# Patient Record
Sex: Female | Born: 1975 | Race: White | Hispanic: No | Marital: Married | Smoking: Never smoker
Health system: Southern US, Community
[De-identification: ages and names within clinical notes are randomized; demographics above are authoritative.]

## PROBLEM LIST (undated history)

## (undated) DIAGNOSIS — Q7961 Classical Ehlers-Danlos syndrome: Secondary | ICD-10-CM

## (undated) DIAGNOSIS — E039 Hypothyroidism, unspecified: Secondary | ICD-10-CM

## (undated) DIAGNOSIS — D649 Anemia, unspecified: Secondary | ICD-10-CM

## (undated) DIAGNOSIS — Z8489 Family history of other specified conditions: Secondary | ICD-10-CM

## (undated) DIAGNOSIS — T8859XA Other complications of anesthesia, initial encounter: Secondary | ICD-10-CM

## (undated) DIAGNOSIS — Z9889 Other specified postprocedural states: Secondary | ICD-10-CM

## (undated) DIAGNOSIS — D8940 Mast cell activation, unspecified: Secondary | ICD-10-CM

## (undated) DIAGNOSIS — N946 Dysmenorrhea, unspecified: Secondary | ICD-10-CM

## (undated) HISTORY — PX: BREAST BIOPSY: SHX20

## (undated) HISTORY — PX: KNEE ARTHROSCOPY: SHX127

---
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
## 2007-01-29 HISTORY — PX: TOTAL THYROIDECTOMY: SHX2547

## 2007-01-29 HISTORY — PX: PARATHYROIDECTOMY: SHX19

## 2007-04-29 DIAGNOSIS — C73 Malignant neoplasm of thyroid gland: Secondary | ICD-10-CM

## 2007-04-29 HISTORY — DX: Malignant neoplasm of thyroid gland: C73

## 2008-01-29 DIAGNOSIS — O009 Unspecified ectopic pregnancy without intrauterine pregnancy: Secondary | ICD-10-CM

## 2008-01-29 HISTORY — DX: Unspecified ectopic pregnancy without intrauterine pregnancy: O00.90

## 2008-01-29 HISTORY — PX: ECTOPIC PREGNANCY SURGERY: SHX613

## 2013-04-19 ENCOUNTER — Emergency Department: Payer: Self-pay | Admitting: Emergency Medicine

## 2013-04-20 LAB — CBC
HCT: 35.3 % (ref 35.0–47.0)
HGB: 11.1 g/dL — AB (ref 12.0–16.0)
MCH: 22.4 pg — ABNORMAL LOW (ref 26.0–34.0)
MCHC: 31.5 g/dL — ABNORMAL LOW (ref 32.0–36.0)
MCV: 71 fL — ABNORMAL LOW (ref 80–100)
Platelet: 321 10*3/uL (ref 150–440)
RBC: 4.95 10*6/uL (ref 3.80–5.20)
RDW: 16.8 % — ABNORMAL HIGH (ref 11.5–14.5)
WBC: 13.5 10*3/uL — ABNORMAL HIGH (ref 3.6–11.0)

## 2013-04-20 LAB — BASIC METABOLIC PANEL
ANION GAP: 5 — AB (ref 7–16)
BUN: 22 mg/dL — AB (ref 7–18)
Calcium, Total: 8.7 mg/dL (ref 8.5–10.1)
Chloride: 106 mmol/L (ref 98–107)
Co2: 27 mmol/L (ref 21–32)
Creatinine: 0.89 mg/dL (ref 0.60–1.30)
EGFR (African American): >60
Glucose: 145 mg/dL — ABNORMAL HIGH (ref 65–99)
Osmolality: 282 (ref 275–301)
POTASSIUM: 4.5 mmol/L (ref 3.5–5.1)
SODIUM: 138 mmol/L (ref 136–145)

## 2013-04-20 LAB — HEPATIC FUNCTION PANEL A (ARMC)
ALK PHOS: 169 U/L — AB
ALT: 36 U/L (ref 12–78)
Albumin: 3.7 g/dL (ref 3.4–5.0)
BILIRUBIN TOTAL: 0.2 mg/dL (ref 0.2–1.0)
SGOT(AST): 21 U/L (ref 15–37)
Total Protein: 7.6 g/dL (ref 6.4–8.2)

## 2013-04-20 LAB — TROPONIN I
Troponin-I: <0.02 ng/mL
Troponin-I: <0.02 ng/mL

## 2013-04-20 LAB — LIPASE, BLOOD: LIPASE: 134 U/L (ref 73–393)

## 2013-04-20 LAB — PRO B NATRIURETIC PEPTIDE: B-TYPE NATIURETIC PEPTID: 25 pg/mL (ref 0–125)

## 2015-08-18 IMAGING — CT CT ANGIO CHEST
3 of 12 series · 18 of 38 positions shown · IV contrast (APPLIED)
Comparison: No priors.

CLINICAL DATA: Chest pain.  Fourteen weeks postpartum.

EXAM:
CT ANGIOGRAPHY CHEST WITH CONTRAST
TECHNIQUE: Multidetector CT imaging of the chest was performed using the
standard protocol during bolus administration of intravenous
contrast. Multiplanar CT image reconstructions and MIPs were
obtained to evaluate the vascular anatomy.
CONTRAST:  100 mL of Isovue 370.

[Series 7: pe 2.0 · axial · 0.61mm/px · z∈[-576,-488]mm · 4 of 74 slices shown]
[im 15/74  lung]
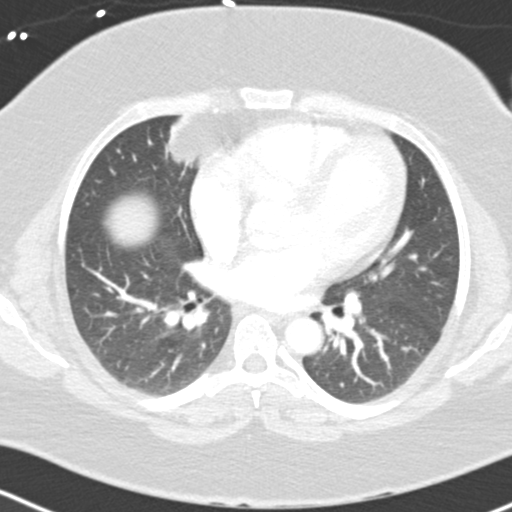
[im 30/74  lung]
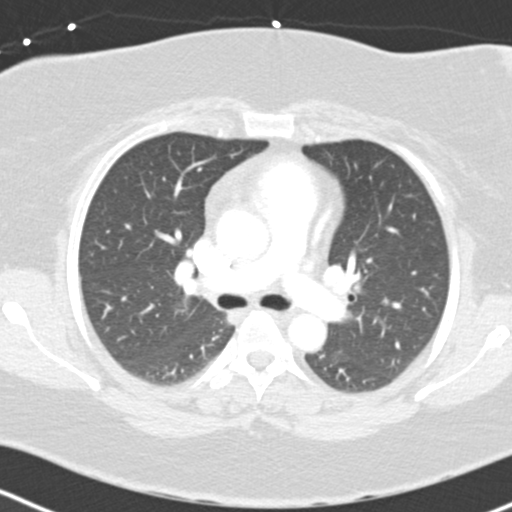
[im 44/74  lung]
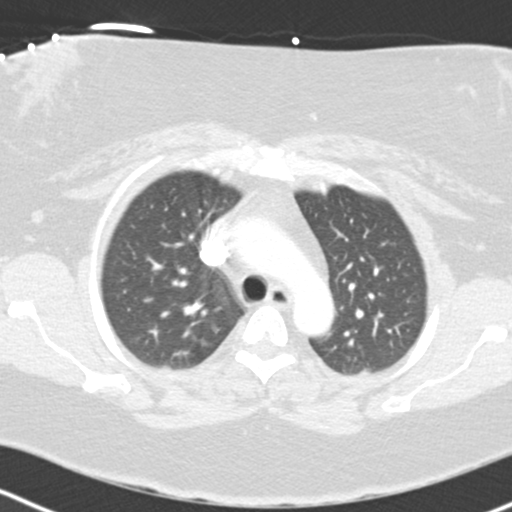
[im 59/74  lung]
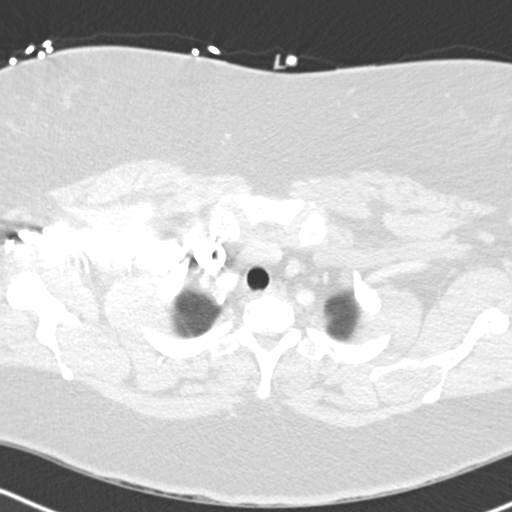

[Series 8: pe 1.0 thins · axial · 0.61mm/px · z∈[-588,-474]mm · 8 of 148 slices shown (1 of 2)]
[im 17/148  lung]
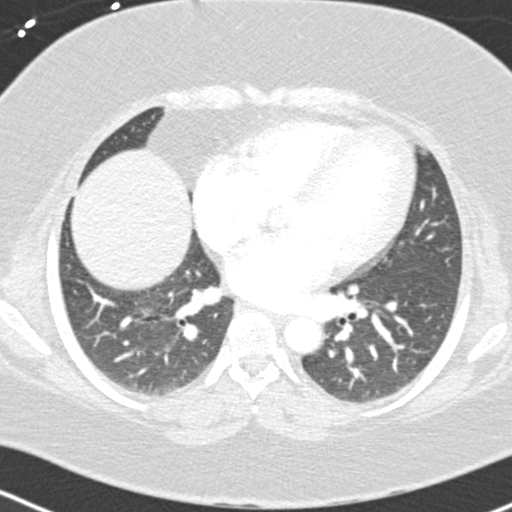
[im 33/148  mediastinal]
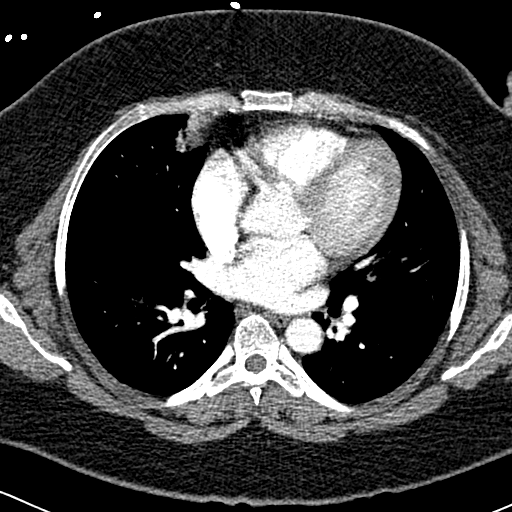
[im 50/148  lung]
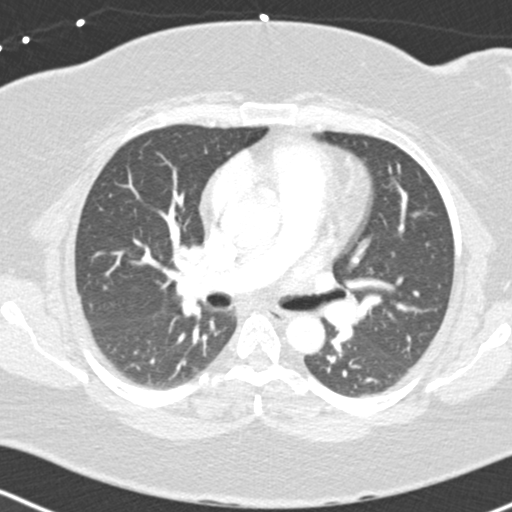
[im 66/148  mediastinal]
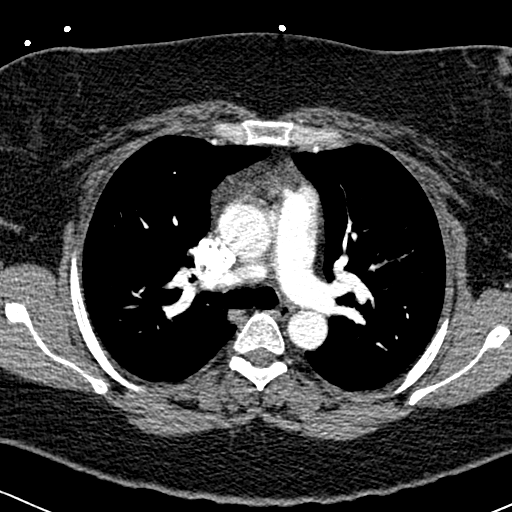
[im 82/148  lung]
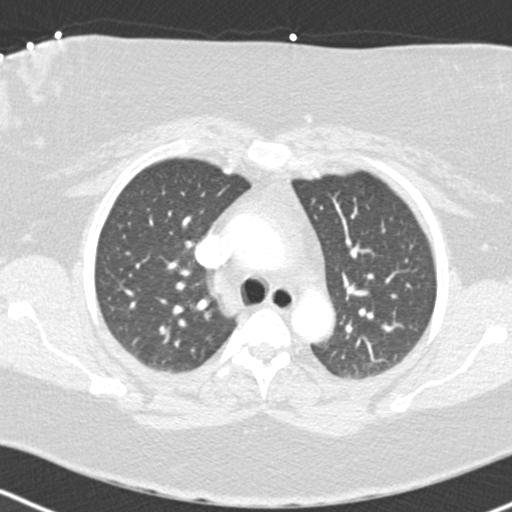
[im 99/148  mediastinal]
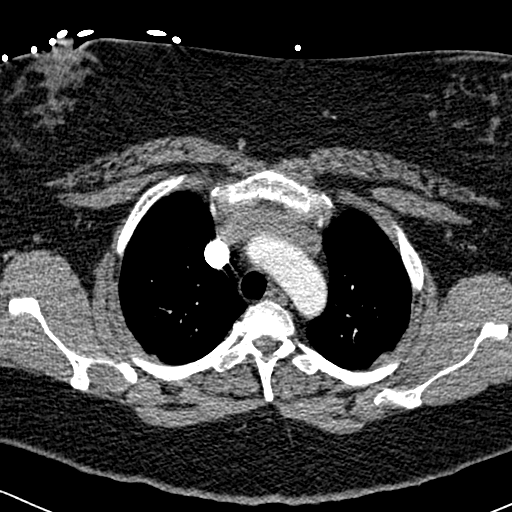
[im 115/148  lung]
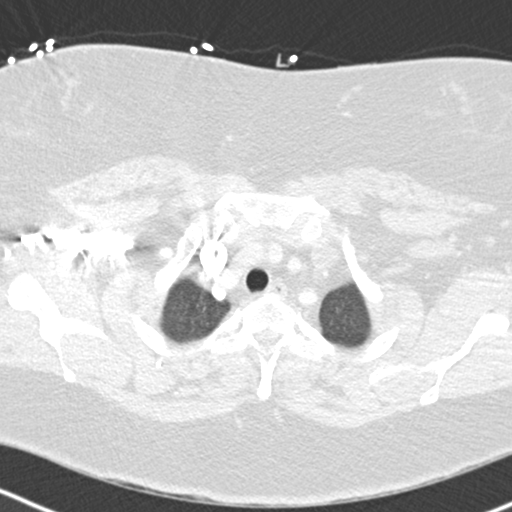
[im 131/148  mediastinal]
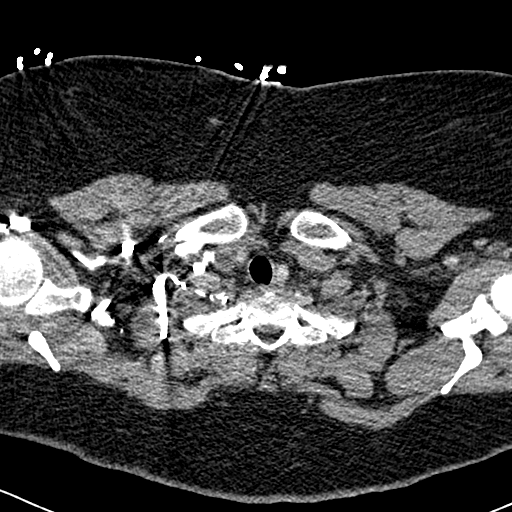

[Series 11: pe 1.0 thins · axial · 0.61mm/px · z∈[-620,-534]mm · 6 of 120 slices shown (2 of 2)]
[im 18/120  lung]
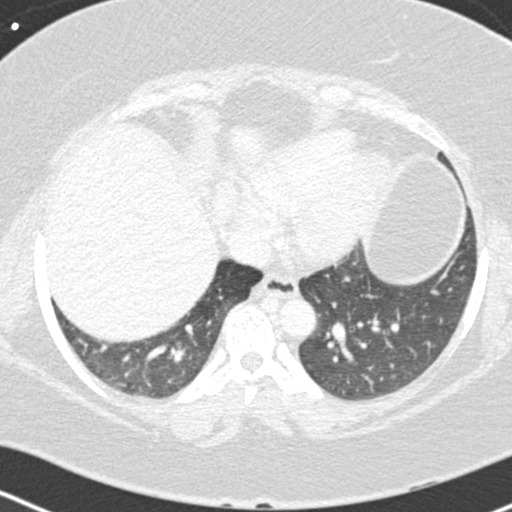
[im 35/120  lung]
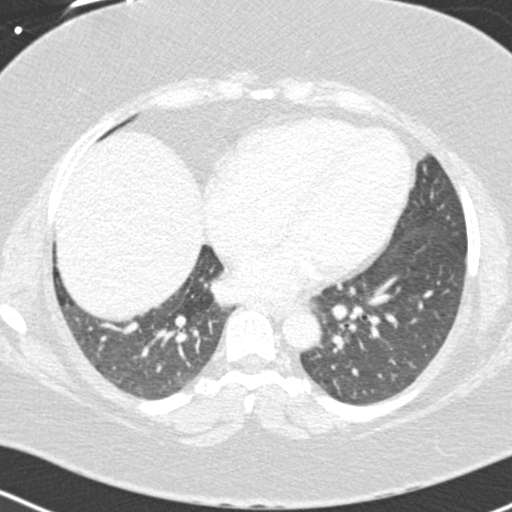
[im 52/120  lung]
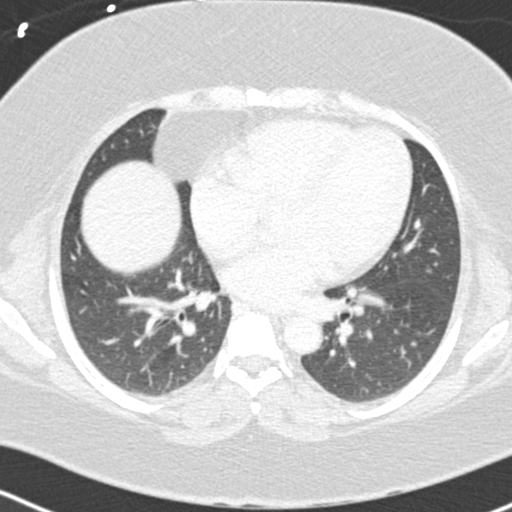
[im 69/120  lung]
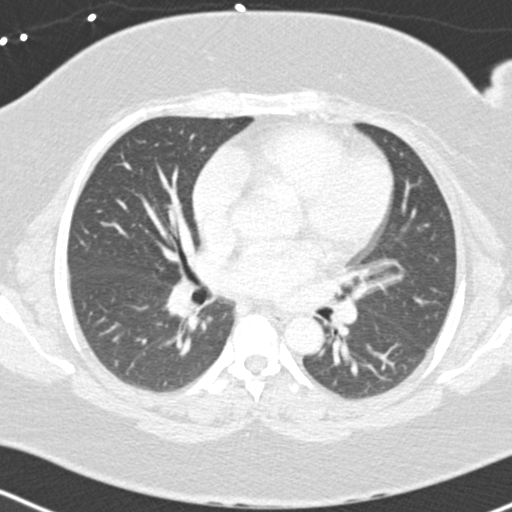
[im 86/120  lung]
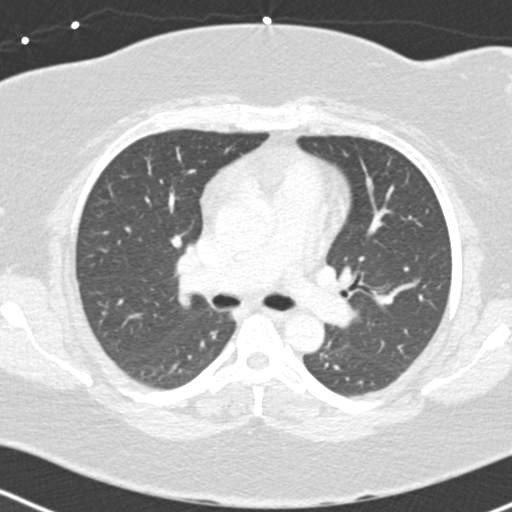
[im 103/120  lung]
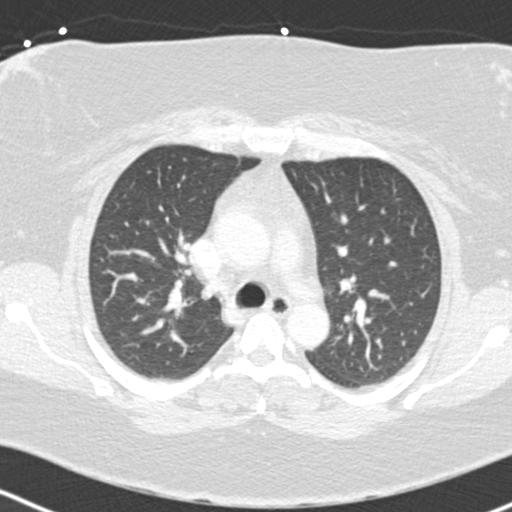

[18 of 38 positions shown; findings below may reference images not displayed]

FINDINGS: Mediastinum: No filling defects within the pulmonary arterial tree
to suggest underlying pulmonary embolism. Heart size is mildly
enlarged. There is no significant pericardial fluid, thickening or
pericardial calcification. No pathologically enlarged mediastinal or
hilar lymph nodes. Esophagus is unremarkable in appearance. Small
amount of some amorphous soft tissue in the anterior mediastinum has
an appearance most compatible with residual thymic tissue.

Lungs/Pleura: Within the visualized portions of the lungs (extreme
lung bases or incompletely imaged) there is no acute consolidative
airspace disease. No pleural effusions. No suspicious appearing
pulmonary nodules or masses are identified.

Upper Abdomen: Unremarkable.

Musculoskeletal: There are no aggressive appearing lytic or blastic
lesions noted in the visualized portions of the skeleton.

Review of the MIP images confirms the above findings.
IMPRESSION: 1. No evidence of pulmonary embolism.
2. No acute findings in the visualized thorax to account for the
patient's symptoms.
3. Small amount of amorphous soft tissue in the anterior mediastinum
is most likely to represent residual thymic tissue.

## 2015-08-18 IMAGING — CR DG CHEST 2V
1 series · 2 of 2 positions shown · non-contrast
Comparison: None available for comparison at time of study
interpretation.

CLINICAL DATA: Chest pain.

EXAM:
CHEST  2 VIEW

[Series 1: w chest pa · 0.14mm/px · 2 of 2 slices shown]
[im 1/2]
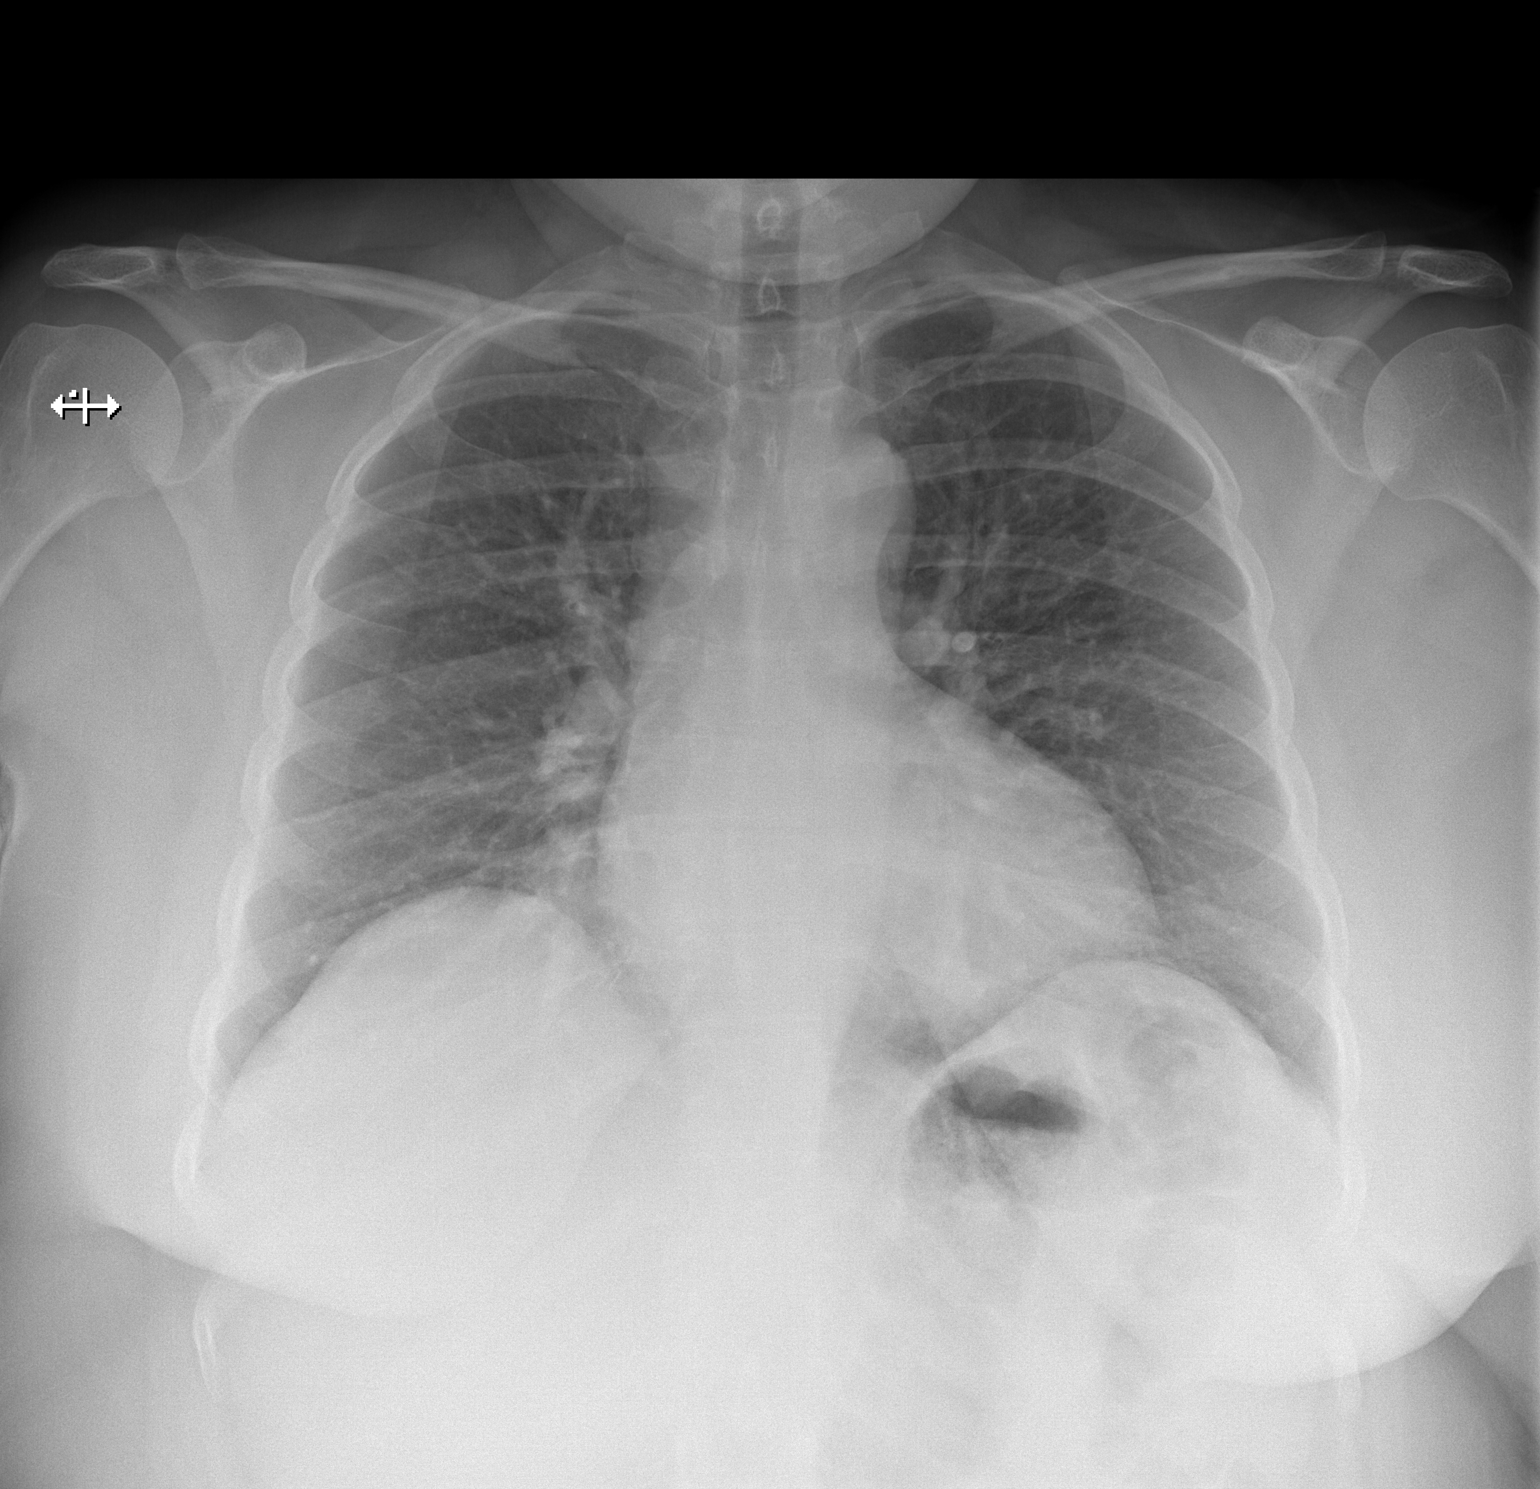
[im 2/2]
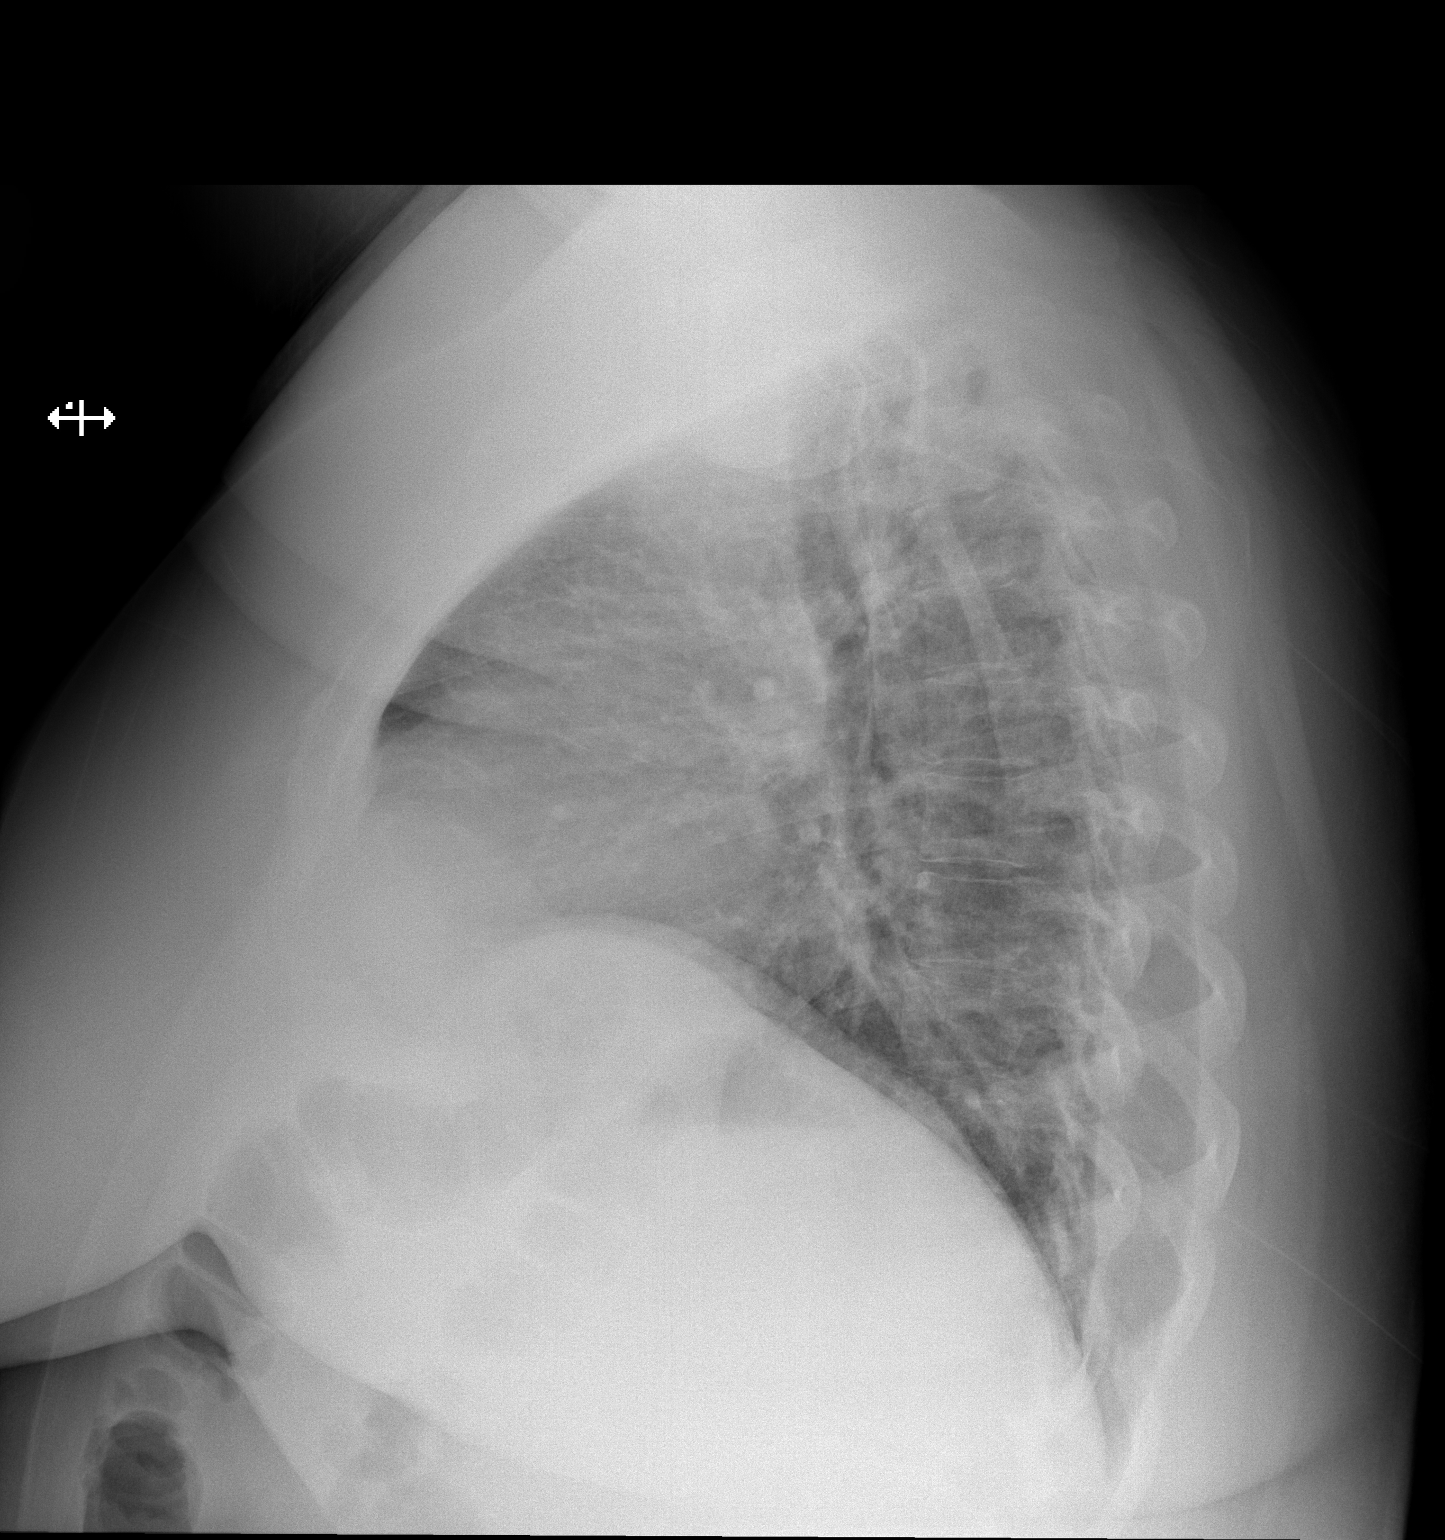

[2 of 2 positions shown; findings below may reference images not displayed]

FINDINGS: Cardiomediastinal silhouette is unremarkable. The lungs are clear
without pleural effusions or focal consolidations. Trachea projects
midline and there is no pneumothorax. Soft tissue planes and
included osseous structures are non-suspicious. Large body habitus.
IMPRESSION: No acute cardiopulmonary process.

  By: Quirijn Amazigh

## 2017-03-11 ENCOUNTER — Other Ambulatory Visit: Payer: Self-pay | Admitting: Certified Nurse Midwife

## 2017-03-11 DIAGNOSIS — Z1231 Encounter for screening mammogram for malignant neoplasm of breast: Secondary | ICD-10-CM

## 2017-03-26 ENCOUNTER — Ambulatory Visit
Admission: RE | Admit: 2017-03-26 | Discharge: 2017-03-26 | Disposition: A | Discharge status: Home / Self Care | Payer: BC Managed Care – PPO | Source: Ambulatory Visit | Attending: Certified Nurse Midwife | Admitting: Certified Nurse Midwife

## 2017-03-26 ENCOUNTER — Other Ambulatory Visit: Payer: Self-pay | Admitting: Certified Nurse Midwife

## 2017-03-26 ENCOUNTER — Encounter: Payer: Self-pay | Admitting: Radiology

## 2017-03-26 DIAGNOSIS — Z1231 Encounter for screening mammogram for malignant neoplasm of breast: Secondary | ICD-10-CM

## 2021-10-22 ENCOUNTER — Other Ambulatory Visit: Payer: Self-pay | Admitting: Obstetrics

## 2021-10-22 DIAGNOSIS — Z1231 Encounter for screening mammogram for malignant neoplasm of breast: Secondary | ICD-10-CM

## 2021-11-13 ENCOUNTER — Ambulatory Visit
Admission: RE | Admit: 2021-11-13 | Discharge: 2021-11-13 | Disposition: A | Discharge status: Home / Self Care | Payer: BC Managed Care – PPO | Source: Ambulatory Visit | Attending: Obstetrics | Admitting: Obstetrics

## 2021-11-13 DIAGNOSIS — Z1231 Encounter for screening mammogram for malignant neoplasm of breast: Secondary | ICD-10-CM | POA: Diagnosis present

## 2021-11-21 ENCOUNTER — Other Ambulatory Visit: Payer: Self-pay | Admitting: Obstetrics and Gynecology

## 2021-12-14 ENCOUNTER — Encounter
Admission: RE | Admit: 2021-12-14 | Discharge: 2021-12-14 | Disposition: A | Discharge status: Home / Self Care | Payer: BC Managed Care – PPO | Source: Ambulatory Visit | Attending: Anesthesiology | Admitting: Anesthesiology

## 2021-12-14 NOTE — Consult Note (Signed)
Patient presents for preop evaluation. 11y F with history of Ehlers Danlos syndrome, mast cell activation syndrome, BMI 52, anesthesia complications. Patient states that she has had difficulty with throat swelling after extubation. Swelling resolved with IM epinephrine, has not required re-intubation or unplanned prolonged intubation. She denies being alerted to having a difficult airway. Patient is MP IV with large neck. For her last surgery she was pretreated with Benadryl and did not have any issues. She also has severe PONV. She denies any  CV issues or vascular involvement. RRR on exam, no murmurs noted. She states she has had an echo in the past (8 years ago with pregnancy) that was normal. She does not follow with a cardiologist. Denies any pulmonary complications. Her father had Drue Dun with vascular involvement, he died in his early 63'F from complications of anesthesia.  Patient ok to proceed with planned surgery at [facility].  Birdie Riddle, MD  Anesthesiology

## 2022-01-08 NOTE — H&P (Signed)
Chief Complaint:  Cheryl Day is a 46 y.o. female presenting with Pre Op Consulting (Sign consents/embx)   History of Present Illness: Patient returns for preop for Hyst.  She has hx of ehlers danlos, ectopic pregnancy and fetal demise with her G1 multiple gestation. Her cycles are 28 days and periods last 7-11 days of heavy flow (She bleeds through super plus tampon and pads) with severe cramping. She has a Fhx of uterine cancer in her mother.    She has a long Fhx of cancers, early cancer in thyroid, multiple breast biopsies. Her bleeding and dysmenorrhea has gotten worse every year.    Last pap 9/23: neg/neg   TVUS 10/2021 Uterus=9.67 x 5.16 x 4.82 cm Uterus anteverted Endometrium=8.63 mm Rt ovary appears wnl Lt simple ovarian cyst=1.2cm No free fluid seen Fibroid seen: posterior=2.3cm       Pertinent Hx: - SVD x 3, 3 living, 2 fetal demise, 1 ectopic  - S/p laparoscopic ectopic removal  - Vasectomy for contraception    - Thyroidectomy 2009 for thyroid cancer   Past Medical History:  has a past medical history of Ehlers-Danlos syndrome, classic type (1993), H/O bilateral breast biopsy, Hypothyroidism, and Thyroid cancer (CMS-HCC) (2009).  Past Surgical History:  has a past surgical history that includes Tonsillectomy (1991); thyroidectomy total (2009); Parathyroidectomy (2009); Laparoscopic Removal Ectopic Pregnancy (2010); knee surgery; and Knee surgery (Left). Family History: family history includes Breast cancer in her maternal grandmother, mother, and paternal grandmother; Diabetes Type 1/Insulin Dependent in her father; Diabetes type I in her father; Hodgkin's lymphoma in her paternal grandmother; Leukemia in her son; No Known Problems in her daughter, daughter, maternal grandfather, and sister; Parkinsonism in her paternal grandfather; Thyroid cancer in her maternal grandmother and mother; Uterine cancer in her mother. Social History:  reports that she has never smoked.  She has never used smokeless tobacco. She reports current alcohol use. She reports that she does not currently use drugs. OB/GYN History:  OB History    Gravida 5  Para 3  Term    Preterm 3  AB 2  Living 3    SAB 1  IAB    Ectopic 1  Molar    Multiple 1  Live Births 3      Allergies: is allergic to iodinated contrast media, codeine, and penicillins. Medications: Current Outpatient Medications:    CALCIUM ORAL, Take 2 tablets by mouth once daily Low dose, Disp: , Rfl:    cyclobenzaprine (FLEXERIL) 5 MG tablet, Take 5 mg by mouth once daily as needed, Disp: , Rfl:    levothyroxine (SYNTHROID) 112 MCG tablet, Take 2 tablets (224 mcg total) by mouth once daily Take on an empty stomach with a glass of water at least 30-60 minutes before breakfast., Disp: 180 tablet, Rfl: 3   phentermine (ADIPEX-P) 37.5 mg tablet, Take 1 tablet (37.5 mg total) by mouth every morning before breakfast, Disp: 30 tablet, Rfl: 5   SUMAtriptan (IMITREX) 25 MG tablet, Take 1 tablet (25 mg total) by mouth once daily as needed, Disp: 30 tablet, Rfl: 3  Review of Systems: No SOB, no palpitations or chest pain, no new lower extremity edema, no nausea or vomiting or bowel or bladder complaints. See HPI for gyn specific ROS.   Exam:  BP (!) 162/101   Ht 160 cm ('5\' 3"'$ )   Wt (!) 130.7 kg (288 lb 3.2 oz)   LMP 01/08/2022   BMI 51.05 kg/m   General: Patient is well-groomed, well-nourished, appears  stated age in no acute distress   HEENT: head is atraumatic and normocephalic, trachea is midline, neck is supple with no palpable nodules   CV: Regular rhythm and normal heart rate, no murmur   Pulm: Clear to auscultation throughout lung fields with no wheezing, crackles, or rhonchi. No increased work of breathing  Abdomen: soft , no mass, non-tender, no rebound tenderness, no hepatomegaly  Pelvic: tanner stage 5 ,   External genitalia: vulva /labia no lesions  Urethra: no  prolapse  Vagina: normal physiologic d/c, laxity in vaginal walls  Cervix: no lesions, no cervical motion tenderness, good descent  Uterus: normal size shape and contour, non-tender  Endometrial biopsy: The cervix was cleaned with betadine, topical Hurriciane spray applied, and a single tooth tenaculum is applied to the anterior cervix. The Pipelle catheter was placed into the endometrial cavity. It sounds to 7 cm and adequate tissue was removed.   A female chaperone was present for the more sensitive portions of the physical exam ( such as breast and pelvic)   Impression:  The primary encounter diagnosis was Menorrhagia with regular cycle. A diagnosis of Primary dysmenorrhea was also pertinent to this visit.  Plan:  1. Preop for hysterectomy, dysmenorrhea, menometrorrhagia with failed medical management  -Patient returns for a preoperative discussion regarding her plans to proceed with surgical treatment of her dysmenorrhea and menorrhagia by robotically assisted total laparoscopic hysterectomy with bilateral salpingectomy procedure.  We may perform a cystoscopy to evaluate the urinary tract after the procedure, if surgically indicated for uro tract integrity.   The patient and I discussed the technical aspects of the procedure including the potential for risks and complications.  These include but are not limited to the risk of infection requiring post-operative antibiotics or further procedures.  We talked about the risk of injury to adjacent organs including bladder, bowel, ureter, blood vessels or nerves.  We talked about the need to convert to an open incision.  We talked about the possible need for blood transfusion.  We talked about postop complications such as thromboembolic or cardiopulmonary complications.  All of her questions were answered.  Her preoperative exam was completed and the appropriate consents were signed. She is scheduled to undergo this procedure in the near future.  -  In the past, the only thing that has helped with her pain is the immediate relief morphine oral tablets. Plan for otherwise standard ERAS   - Ehlers Danlos positioning  Specific Peri-operative Considerations:  - Consent: obtained today - Health Maintenance:  - Labs: CBC, CMP preoperatively - Studies: EKG, CXR preoperatively - Bowel Preparation: None required - Abx:  Ancef 3g - VTE ppx: SCDs perioperatively - Glucose Protocol: n/a - Beta-blockade: n/a  Diagnoses and all orders for this visit:  Menorrhagia with regular cycle -     Pathology Report - [facility]  Primary dysmenorrhea  Return for Postop check. ~~~~~~~~~~~~~~~~~~~~~~~~~~~~~~~~~~~~~~~~~~~~~~~~~~~~~~~~~~~~ This note is partially written by Geraldine Solar, in the presence of and acting as the scribe of Dr. Benjaman Kindler, who has reviewed, edited and added to the note to reflect her best personal medical judgment.  This note was generated in part with voice recognition software and I apologize for any typographical errors that were not detected and corrected.    Attestation Statement:  I personally performed the service. (TP)  Tyon Cerasoli Monika Salk, MD

## 2022-01-17 ENCOUNTER — Encounter
Admission: RE | Admit: 2022-01-17 | Discharge: 2022-01-17 | Disposition: A | Discharge status: Home / Self Care | Payer: BC Managed Care – PPO | Source: Ambulatory Visit | Attending: Obstetrics and Gynecology | Admitting: Obstetrics and Gynecology

## 2022-01-17 VITALS — Ht 63.0 in | Wt 288.0 lb

## 2022-01-17 DIAGNOSIS — Z01812 Encounter for preprocedural laboratory examination: Secondary | ICD-10-CM

## 2022-01-17 HISTORY — DX: Classical Ehlers-Danlos syndrome: Q79.61

## 2022-01-17 HISTORY — DX: Dysmenorrhea, unspecified: N94.6

## 2022-01-17 HISTORY — DX: Nausea with vomiting, unspecified: Z98.890

## 2022-01-17 HISTORY — DX: Other complications of anesthesia, initial encounter: T88.59XA

## 2022-01-17 HISTORY — DX: Hypothyroidism, unspecified: E03.9

## 2022-01-17 HISTORY — DX: Anemia, unspecified: D64.9

## 2022-01-17 HISTORY — DX: Mast cell activation, unspecified: D89.40

## 2022-01-17 HISTORY — DX: Family history of other specified conditions: Z84.89

## 2022-01-17 NOTE — Patient Instructions (Addendum)
Your procedure is scheduled on: Thursday, December 28 Report to the Registration Desk on the 1st floor of the [facility]. To find out your arrival time, please call [phone] between 1PM - 3PM on: Wednesday, December 27 If your arrival time is 6:00 am, do not arrive prior to that time as the [facility] entrance doors do not open until 6:00 am.  REMEMBER: Instructions that are not followed completely may result in serious medical risk, up to and including death; or upon the discretion of your surgeon and anesthesiologist your surgery may need to be rescheduled.  Do not eat food after midnight the night before surgery.  No gum chewing, lozengers or hard candies.  You may however, drink CLEAR liquids up to 2 hours before you are scheduled to arrive for your surgery. Do not drink anything within 2 hours of your scheduled arrival time.  Clear liquids include: - water  - apple juice without pulp - gatorade (not RED colors) - black coffee or tea (Do NOT add milk or creamers to the coffee or tea) Do NOT drink anything that is not on this list.  TAKE THESE MEDICATIONS THE MORNING OF SURGERY WITH A SIP OF WATER:  Levothyroxine  One week prior to surgery: starting December 21 Stop Anti-inflammatories (NSAIDS) such as Advil, Aleve, Ibuprofen, Motrin, Naproxen, Naprosyn and Aspirin based products such as Excedrin, Goodys Powder, BC Powder. Stop ANY OVER THE COUNTER supplements until after surgery. You may however, continue to take Tylenol if needed for pain up until the day of surgery.  No Alcohol for 24 hours before or after surgery.  No Smoking including e-cigarettes for 24 hours prior to surgery.  No chewable tobacco products for at least 6 hours prior to surgery.  No nicotine patches on the day of surgery.  Do not use any "recreational" drugs for at least a week prior to your surgery.  Please be advised that the combination of cocaine and anesthesia may have negative outcomes, up  to and including death. If you test positive for cocaine, your surgery will be cancelled.  On the morning of surgery brush your teeth with toothpaste and water, you may rinse your mouth with mouthwash if you wish. Do not swallow any toothpaste or mouthwash.  Use CHG Soap as directed on instruction sheet.  Do not wear jewelry, make-up, hairpins, clips or nail polish.  Do not wear lotions, powders, or perfumes.   Do not shave body from the neck down 48 hours prior to surgery just in case you cut yourself which could leave a site for infection.  Also, freshly shaved skin may become irritated if using the CHG soap.  Contact lenses, hearing aids and dentures may not be worn into surgery.  Do not bring valuables to the hospital. [facility] is not responsible for any missing/lost belongings or valuables.   Notify your doctor if there is any change in your medical condition (cold, fever, infection).  Wear comfortable clothing (specific to your surgery type) to the hospital.  After surgery, you can help prevent lung complications by doing breathing exercises.  Take deep breaths and cough every 1-2 hours. Your doctor may order a device called an Incentive Spirometer to help you take deep breaths. When coughing or sneezing, hold a pillow firmly against your incision with both hands. This is called "splinting." Doing this helps protect your incision. It also decreases belly discomfort.  If you are being admitted to the hospital overnight, leave your suitcase in the car.  After surgery it may be brought to your room.  If you are being discharged the day of surgery, you will not be allowed to drive home. You will need a responsible adult (18 years or older) to drive you home and stay with you that night.   If you are taking public transportation, you will need to have a responsible adult (18 years or older) with you. Please confirm with your physician that it is acceptable to use public  transportation.   Please call the [place] Dept[facility] at [phone] if you have any questions about these instructions.  Surgery Visitation Policy:  Patients undergoing a surgery or procedure may have two family members or support persons with them as long as the person is not COVID-19 positive or experiencing its symptoms.   Inpatient Visitation:    Visiting hours are 7 a.m. to 8 p.m. Up to four visitors are allowed at one time in a patient room. The visitors may rotate out with other people during the day. One designated support person (adult) may remain overnight.  Due to an increase in RSV and influenza rates and associated hospitalizations, children ages 73 and under will not be able to visit patients in [facility]. Masks continue to be strongly recommended.     Preparing for Surgery with CHLORHEXIDINE GLUCONATE (CHG) Soap  Chlorhexidine Gluconate (CHG) Soap  o An antiseptic cleaner that kills germs and bonds with the skin to continue killing germs even after washing  o Used for showering the night before surgery and morning of surgery  Before surgery, you can play an important role by reducing the number of germs on your skin.  CHG (Chlorhexidine gluconate) soap is an antiseptic cleanser which kills germs and bonds with the skin to continue killing germs even after washing.  Please do not use if you have an allergy to CHG or antibacterial soaps. If your skin becomes reddened/irritated stop using the CHG.  1. Shower the NIGHT BEFORE SURGERY and the MORNING OF SURGERY with CHG soap.  2. If you choose to wash your hair, wash your hair first as usual with your normal shampoo.  3. After shampooing, rinse your hair and body thoroughly to remove the shampoo.  4. Use CHG as you would any other liquid soap. You can apply CHG directly to the skin and wash gently with a scrungie or a clean washcloth.  5. Apply the CHG soap to your body only from the neck  down. Do not use on open wounds or open sores. Avoid contact with your eyes, ears, mouth, and genitals (private parts). Wash face and genitals (private parts) with your normal soap.  6. Wash thoroughly, paying special attention to the area where your surgery will be performed.  7. Thoroughly rinse your body with warm water.  8. Do not shower/wash with your normal soap after using and rinsing off the CHG soap.  9. Pat yourself dry with a clean towel.  10. Wear clean pajamas to bed the night before surgery.  12. Place clean sheets on your bed the night of your first shower and do not sleep with pets.  13. Shower again with the CHG soap on the day of surgery prior to arriving at the hospital.  14. Do not apply any deodorants/lotions/powders.  15. Please wear clean clothes to the hospital.

## 2022-01-18 ENCOUNTER — Encounter: Payer: Self-pay | Admitting: Urgent Care

## 2022-01-18 ENCOUNTER — Encounter
Admission: RE | Admit: 2022-01-18 | Discharge: 2022-01-18 | Disposition: A | Discharge status: Home / Self Care | Payer: BC Managed Care – PPO | Source: Ambulatory Visit | Attending: Obstetrics and Gynecology | Admitting: Obstetrics and Gynecology

## 2022-01-18 DIAGNOSIS — N921 Excessive and frequent menstruation with irregular cycle: Secondary | ICD-10-CM | POA: Insufficient documentation

## 2022-01-18 DIAGNOSIS — Z01812 Encounter for preprocedural laboratory examination: Secondary | ICD-10-CM | POA: Insufficient documentation

## 2022-01-18 LAB — CBC
HCT: 41.9 % (ref 36.0–46.0)
Hemoglobin: 13.1 g/dL (ref 12.0–15.0)
MCH: 24 pg — ABNORMAL LOW (ref 26.0–34.0)
MCHC: 31.3 g/dL (ref 30.0–36.0)
MCV: 76.9 fL — ABNORMAL LOW (ref 80.0–100.0)
Platelets: 393 10*3/uL (ref 150–400)
RBC: 5.45 MIL/uL — ABNORMAL HIGH (ref 3.87–5.11)
RDW: 15.4 % (ref 11.5–15.5)
WBC: 11.5 10*3/uL — ABNORMAL HIGH (ref 4.0–10.5)
nRBC: 0 % (ref 0.0–0.2)

## 2022-01-18 LAB — BASIC METABOLIC PANEL
Anion gap: 11 (ref 5–15)
BUN: 16 mg/dL (ref 6–20)
CO2: 23 mmol/L (ref 22–32)
Calcium: 9.4 mg/dL (ref 8.9–10.3)
Chloride: 105 mmol/L (ref 98–111)
Creatinine, Ser: 0.73 mg/dL (ref 0.44–1.00)
GFR, Estimated: >60 mL/min (ref >60)
Glucose, Bld: 99 mg/dL (ref 70–99)
Potassium: 3.9 mmol/L (ref 3.5–5.1)
Sodium: 139 mmol/L (ref 135–145)

## 2022-01-24 ENCOUNTER — Ambulatory Visit: Payer: BC Managed Care – PPO | Admitting: Certified Registered Nurse Anesthetist

## 2022-01-24 ENCOUNTER — Ambulatory Visit
Admission: RE | Admit: 2022-01-24 | Discharge: 2022-01-24 | Disposition: A | Discharge status: Home / Self Care | Payer: BC Managed Care – PPO | Attending: Obstetrics and Gynecology | Admitting: Obstetrics and Gynecology

## 2022-01-24 ENCOUNTER — Other Ambulatory Visit: Payer: Self-pay

## 2022-01-24 ENCOUNTER — Encounter: Payer: Self-pay | Admitting: Obstetrics and Gynecology

## 2022-01-24 ENCOUNTER — Encounter
Admission: RE | Disposition: A | Discharge status: Home / Self Care | Payer: Self-pay | Source: Home / Self Care | Attending: Obstetrics and Gynecology

## 2022-01-24 DIAGNOSIS — Z01812 Encounter for preprocedural laboratory examination: Secondary | ICD-10-CM

## 2022-01-24 DIAGNOSIS — Z85850 Personal history of malignant neoplasm of thyroid: Secondary | ICD-10-CM | POA: Insufficient documentation

## 2022-01-24 DIAGNOSIS — Q796 Ehlers-Danlos syndrome, unspecified: Secondary | ICD-10-CM | POA: Diagnosis not present

## 2022-01-24 DIAGNOSIS — N946 Dysmenorrhea, unspecified: Secondary | ICD-10-CM | POA: Diagnosis not present

## 2022-01-24 DIAGNOSIS — D8940 Mast cell activation, unspecified: Secondary | ICD-10-CM | POA: Insufficient documentation

## 2022-01-24 DIAGNOSIS — E039 Hypothyroidism, unspecified: Secondary | ICD-10-CM | POA: Insufficient documentation

## 2022-01-24 DIAGNOSIS — Z79899 Other long term (current) drug therapy: Secondary | ICD-10-CM | POA: Diagnosis not present

## 2022-01-24 DIAGNOSIS — N8000 Endometriosis of the uterus, unspecified: Secondary | ICD-10-CM | POA: Insufficient documentation

## 2022-01-24 DIAGNOSIS — Z79890 Hormone replacement therapy (postmenopausal): Secondary | ICD-10-CM | POA: Diagnosis not present

## 2022-01-24 DIAGNOSIS — D252 Subserosal leiomyoma of uterus: Secondary | ICD-10-CM | POA: Diagnosis not present

## 2022-01-24 DIAGNOSIS — Z6841 Body Mass Index (BMI) 40.0 and over, adult: Secondary | ICD-10-CM | POA: Insufficient documentation

## 2022-01-24 DIAGNOSIS — N921 Excessive and frequent menstruation with irregular cycle: Secondary | ICD-10-CM

## 2022-01-24 DIAGNOSIS — N920 Excessive and frequent menstruation with regular cycle: Secondary | ICD-10-CM | POA: Diagnosis present

## 2022-01-24 DIAGNOSIS — F419 Anxiety disorder, unspecified: Secondary | ICD-10-CM | POA: Diagnosis not present

## 2022-01-24 LAB — TYPE AND SCREEN
ABO/RH(D): O POS
Antibody Screen: NEGATIVE

## 2022-01-24 LAB — POCT PREGNANCY, URINE: Preg Test, Ur: NEGATIVE

## 2022-01-24 LAB — ABO/RH: ABO/RH(D): O POS

## 2022-01-24 SURGERY — XI ROBOTIC ASSISTED TOTAL HYSTERECTOMY BILATERAL SALPINGO OOPHORECTOMY WITH OMENTECTOMY AND DEBULKING
Anesthesia: General | Site: Uterus

## 2022-01-24 MED ORDER — CHLORHEXIDINE GLUCONATE 0.12 % MT SOLN
OROMUCOSAL | Status: AC
  Administered 2022-01-24: 15 mL via OROMUCOSAL
  Filled 2022-01-24: qty 15

## 2022-01-24 MED ORDER — FENTANYL CITRATE (PF) 100 MCG/2ML IJ SOLN
INTRAMUSCULAR | Status: AC
  Administered 2022-01-24: 25 ug via INTRAVENOUS
  Filled 2022-01-24: qty 2

## 2022-01-24 MED ORDER — POVIDONE-IODINE 10 % EX SWAB
2.0000 | Freq: Once | CUTANEOUS | Status: AC
  Administered 2022-01-24: 2 via TOPICAL

## 2022-01-24 MED ORDER — GABAPENTIN 300 MG PO CAPS: 300.0000 mg | ORAL_CAPSULE | ORAL | Status: AC

## 2022-01-24 MED ORDER — FENTANYL CITRATE (PF) 100 MCG/2ML IJ SOLN
INTRAMUSCULAR | Status: AC
  Filled 2022-01-24: qty 2

## 2022-01-24 MED ORDER — MORPHINE SULFATE (PF) 4 MG/ML IV SOLN
INTRAVENOUS | Status: AC
  Filled 2022-01-24: qty 1

## 2022-01-24 MED ORDER — PROPOFOL 1000 MG/100ML IV EMUL
INTRAVENOUS | Status: AC
  Filled 2022-01-24: qty 100

## 2022-01-24 MED ORDER — FENTANYL CITRATE (PF) 100 MCG/2ML IJ SOLN
INTRAMUSCULAR | Status: DC | PRN
  Administered 2022-01-24 (×2): 50 ug via INTRAVENOUS

## 2022-01-24 MED ORDER — DIPHENHYDRAMINE HCL 50 MG/ML IJ SOLN
INTRAMUSCULAR | Status: AC
  Filled 2022-01-24: qty 1

## 2022-01-24 MED ORDER — DIPHENHYDRAMINE HCL 50 MG/ML IJ SOLN
INTRAMUSCULAR | Status: AC
  Administered 2022-01-24: 12.5 mg via INTRAVENOUS
  Filled 2022-01-24: qty 1

## 2022-01-24 MED ORDER — DEXAMETHASONE SODIUM PHOSPHATE 10 MG/ML IJ SOLN
10.0000 mg | Freq: Once | INTRAMUSCULAR | Status: AC
  Administered 2022-01-24: 10 mg via INTRAVENOUS

## 2022-01-24 MED ORDER — ACETAMINOPHEN 500 MG PO TABS
ORAL_TABLET | ORAL | Status: AC
  Administered 2022-01-24: 1000 mg via ORAL
  Filled 2022-01-24: qty 2

## 2022-01-24 MED ORDER — IBUPROFEN 800 MG PO TABS: 800.0000 mg | ORAL_TABLET | Freq: Three times a day (TID) | ORAL | 1 refills | Status: AC

## 2022-01-24 MED ORDER — GABAPENTIN 800 MG PO TABS: 800.0000 mg | ORAL_TABLET | Freq: Every day | ORAL | 0 refills | Status: AC

## 2022-01-24 MED ORDER — LACTATED RINGERS IV SOLN: INTRAVENOUS | Status: DC | PRN

## 2022-01-24 MED ORDER — SUCCINYLCHOLINE CHLORIDE 200 MG/10ML IV SOSY
PREFILLED_SYRINGE | INTRAVENOUS | Status: DC | PRN
  Administered 2022-01-24: 100 mg via INTRAVENOUS

## 2022-01-24 MED ORDER — ACETAMINOPHEN EXTRA STRENGTH 500 MG PO TABS: 1000.0000 mg | ORAL_TABLET | Freq: Four times a day (QID) | ORAL | 0 refills | Status: AC

## 2022-01-24 MED ORDER — ONDANSETRON 4 MG PO TBDP: 4.0000 mg | ORAL_TABLET | Freq: Three times a day (TID) | ORAL | 0 refills | Status: AC | PRN

## 2022-01-24 MED ORDER — DEXAMETHASONE SODIUM PHOSPHATE 10 MG/ML IJ SOLN
INTRAMUSCULAR | Status: AC
  Filled 2022-01-24: qty 1

## 2022-01-24 MED ORDER — GABAPENTIN 300 MG PO CAPS
ORAL_CAPSULE | ORAL | Status: AC
  Administered 2022-01-24: 300 mg via ORAL
  Filled 2022-01-24: qty 1

## 2022-01-24 MED ORDER — CEFAZOLIN IN SODIUM CHLORIDE 3-0.9 GM/100ML-% IV SOLN
3.0000 g | INTRAVENOUS | Status: AC
  Administered 2022-01-24: 3 g via INTRAVENOUS
  Filled 2022-01-24: qty 100

## 2022-01-24 MED ORDER — MIDAZOLAM HCL 2 MG/2ML IJ SOLN
INTRAMUSCULAR | Status: DC | PRN
  Administered 2022-01-24: 2 mg via INTRAVENOUS

## 2022-01-24 MED ORDER — MORPHINE SULFATE 15 MG PO TABS
15.0000 mg | ORAL_TABLET | ORAL | Status: DC | PRN
  Administered 2022-01-24: 15 mg via ORAL
  Filled 2022-01-24 (×2): qty 1

## 2022-01-24 MED ORDER — SILVER NITRATE-POT NITRATE 75-25 % EX MISC
CUTANEOUS | Status: AC
  Filled 2022-01-24: qty 10

## 2022-01-24 MED ORDER — EPINEPHRINE PF 1 MG/ML IJ SOLN
INTRAMUSCULAR | Status: AC
  Filled 2022-01-24: qty 1

## 2022-01-24 MED ORDER — FAMOTIDINE 20 MG PO TABS
ORAL_TABLET | ORAL | Status: AC
  Administered 2022-01-24: 20 mg via ORAL
  Filled 2022-01-24: qty 1

## 2022-01-24 MED ORDER — MORPHINE SULFATE (PF) 4 MG/ML IV SOLN
2.0000 mg | Freq: Once | INTRAVENOUS | Status: AC
  Administered 2022-01-24: 2 mg via INTRAVENOUS

## 2022-01-24 MED ORDER — ROCURONIUM BROMIDE 100 MG/10ML IV SOLN
INTRAVENOUS | Status: DC | PRN
  Administered 2022-01-24: 50 mg via INTRAVENOUS
  Administered 2022-01-24 (×2): 20 mg via INTRAVENOUS
  Administered 2022-01-24: 30 mg via INTRAVENOUS

## 2022-01-24 MED ORDER — ONDANSETRON HCL 4 MG/2ML IJ SOLN
INTRAMUSCULAR | Status: DC | PRN
  Administered 2022-01-24: 4 mg via INTRAVENOUS

## 2022-01-24 MED ORDER — PROPOFOL 10 MG/ML IV BOLUS
INTRAVENOUS | Status: AC
  Filled 2022-01-24: qty 20

## 2022-01-24 MED ORDER — DOCUSATE SODIUM 100 MG PO CAPS: 100.0000 mg | ORAL_CAPSULE | Freq: Two times a day (BID) | ORAL | 0 refills | Status: AC

## 2022-01-24 MED ORDER — ONDANSETRON HCL 4 MG/2ML IJ SOLN: 4.0000 mg | Freq: Once | INTRAMUSCULAR | Status: DC | PRN

## 2022-01-24 MED ORDER — CHLORHEXIDINE GLUCONATE 0.12 % MT SOLN: 15.0000 mL | Freq: Once | OROMUCOSAL | Status: AC

## 2022-01-24 MED ORDER — ROCURONIUM BROMIDE 10 MG/ML (PF) SYRINGE
PREFILLED_SYRINGE | INTRAVENOUS | Status: AC
  Filled 2022-01-24: qty 10

## 2022-01-24 MED ORDER — MIDAZOLAM HCL 2 MG/2ML IJ SOLN
INTRAMUSCULAR | Status: AC
  Filled 2022-01-24: qty 2

## 2022-01-24 MED ORDER — FAMOTIDINE 20 MG PO TABS: 20.0000 mg | ORAL_TABLET | Freq: Once | ORAL | Status: AC

## 2022-01-24 MED ORDER — PROPOFOL 10 MG/ML IV BOLUS
INTRAVENOUS | Status: DC | PRN
  Administered 2022-01-24: 140 mg via INTRAVENOUS

## 2022-01-24 MED ORDER — MORPHINE SULFATE 15 MG PO TABS: 15.0000 mg | ORAL_TABLET | ORAL | 0 refills | Status: AC | PRN

## 2022-01-24 MED ORDER — GLYCOPYRROLATE 0.2 MG/ML IJ SOLN
INTRAMUSCULAR | Status: AC
  Filled 2022-01-24: qty 1

## 2022-01-24 MED ORDER — LACTATED RINGERS IV SOLN: INTRAVENOUS | Status: DC

## 2022-01-24 MED ORDER — NITROGLYCERIN IN D5W 200-5 MCG/ML-% IV SOLN
INTRAVENOUS | Status: AC
  Filled 2022-01-24: qty 250

## 2022-01-24 MED ORDER — DIPHENHYDRAMINE HCL 50 MG/ML IJ SOLN
INTRAMUSCULAR | Status: DC | PRN
  Administered 2022-01-24: 12.5 mg via INTRAVENOUS

## 2022-01-24 MED ORDER — LIDOCAINE HCL (PF) 2 % IJ SOLN
INTRAMUSCULAR | Status: AC
  Filled 2022-01-24: qty 5

## 2022-01-24 MED ORDER — SUGAMMADEX SODIUM 200 MG/2ML IV SOLN
INTRAVENOUS | Status: DC | PRN
  Administered 2022-01-24: 247.6 mg via INTRAVENOUS

## 2022-01-24 MED ORDER — PROPOFOL 500 MG/50ML IV EMUL
INTRAVENOUS | Status: DC | PRN
  Administered 2022-01-24: 100 ug/kg/min via INTRAVENOUS

## 2022-01-24 MED ORDER — DEXMEDETOMIDINE HCL IN NACL 80 MCG/20ML IV SOLN
INTRAVENOUS | Status: DC | PRN
  Administered 2022-01-24: 8 ug via BUCCAL
  Administered 2022-01-24: 12 ug via BUCCAL
  Administered 2022-01-24: 4 ug via BUCCAL
  Administered 2022-01-24: 8 ug via BUCCAL

## 2022-01-24 MED ORDER — BUPIVACAINE HCL (PF) 0.5 % IJ SOLN
INTRAMUSCULAR | Status: DC | PRN
  Administered 2022-01-24: 25 mL

## 2022-01-24 MED ORDER — SCOPOLAMINE 1 MG/3DAYS TD PT72
1.0000 | MEDICATED_PATCH | TRANSDERMAL | Status: DC
  Administered 2022-01-24: 1.5 mg via TRANSDERMAL

## 2022-01-24 MED ORDER — LIDOCAINE HCL (CARDIAC) PF 100 MG/5ML IV SOSY
PREFILLED_SYRINGE | INTRAVENOUS | Status: DC | PRN
  Administered 2022-01-24: 80 mg via INTRAVENOUS

## 2022-01-24 MED ORDER — FENTANYL CITRATE (PF) 100 MCG/2ML IJ SOLN
25.0000 ug | INTRAMUSCULAR | Status: DC | PRN
  Administered 2022-01-24 (×5): 25 ug via INTRAVENOUS

## 2022-01-24 MED ORDER — SUGAMMADEX SODIUM 500 MG/5ML IV SOLN
INTRAVENOUS | Status: AC
  Filled 2022-01-24: qty 5

## 2022-01-24 MED ORDER — BUPIVACAINE HCL (PF) 0.5 % IJ SOLN
INTRAMUSCULAR | Status: AC
  Filled 2022-01-24: qty 30

## 2022-01-24 MED ORDER — 0.9 % SODIUM CHLORIDE (POUR BTL) OPTIME
TOPICAL | Status: DC | PRN
  Administered 2022-01-24: 500 mL

## 2022-01-24 MED ORDER — DIPHENHYDRAMINE HCL 50 MG/ML IJ SOLN: 12.5000 mg | Freq: Once | INTRAMUSCULAR | Status: AC

## 2022-01-24 MED ORDER — ORAL CARE MOUTH RINSE: 15.0000 mL | Freq: Once | OROMUCOSAL | Status: AC

## 2022-01-24 MED ORDER — ACETAMINOPHEN 500 MG PO TABS: 1000.0000 mg | ORAL_TABLET | ORAL | Status: AC

## 2022-01-24 MED ORDER — HEMOSTATIC AGENTS (NO CHARGE) OPTIME
TOPICAL | Status: DC | PRN
  Administered 2022-01-24: 1

## 2022-01-24 MED ORDER — ONDANSETRON HCL 4 MG/2ML IJ SOLN
INTRAMUSCULAR | Status: AC
  Filled 2022-01-24: qty 2

## 2022-01-24 MED ORDER — SCOPOLAMINE 1 MG/3DAYS TD PT72
MEDICATED_PATCH | TRANSDERMAL | Status: AC
  Filled 2022-01-24: qty 1

## 2022-01-24 SURGICAL SUPPLY — 65 items
APPLICATOR ARISTA FLEXITIP XL (MISCELLANEOUS) ×2 IMPLANT
BAG URINE DRAIN 2000ML AR STRL (UROLOGICAL SUPPLIES) ×4 IMPLANT
BLADE SURG SZ11 CARB STEEL (BLADE) ×4 IMPLANT
CANNULA CAP OBTURATR AIRSEAL 8 (CAP) ×2 IMPLANT
CATH FOLEY 2WAY  5CC 16FR (CATHETERS) ×4
CATH ROBINSON RED A/P 16FR (CATHETERS) ×2 IMPLANT
CATH URTH 16FR FL 2W BLN LF (CATHETERS) ×4 IMPLANT
CHLORAPREP W/TINT 26 (MISCELLANEOUS) ×4 IMPLANT
COUNTER NEEDLE 20/40 LG (NEEDLE) ×4 IMPLANT
COVER TIP SHEARS 8 DVNC (MISCELLANEOUS) ×4 IMPLANT
COVER TIP SHEARS 8MM DA VINCI (MISCELLANEOUS) ×4
COVER WAND RF STERILE (DRAPES) ×4 IMPLANT
DERMABOND ADVANCED .7 DNX12 (GAUZE/BANDAGES/DRESSINGS) ×4 IMPLANT
DRAPE 3/4 80X56 (DRAPES) ×4 IMPLANT
DRAPE ARM DVNC X/XI (DISPOSABLE) ×14 IMPLANT
DRAPE COLUMN DVNC XI (DISPOSABLE) ×4 IMPLANT
DRAPE DA VINCI XI ARM (DISPOSABLE) ×16
DRAPE DA VINCI XI COLUMN (DISPOSABLE) ×4
DRAPE ROBOT W/ LEGGING 30X125 (DRAPES) ×4 IMPLANT
ELECT REM PT RETURN 9FT ADLT (ELECTROSURGICAL) ×4
ELECTRODE REM PT RTRN 9FT ADLT (ELECTROSURGICAL) ×4 IMPLANT
GAUZE 4X4 16PLY ~~LOC~~+RFID DBL (SPONGE) ×4 IMPLANT
GLOVE BIO SURGEON STRL SZ7 (GLOVE) ×16 IMPLANT
GLOVE SURG UNDER LTX SZ7.5 (GLOVE) ×16 IMPLANT
GOWN STRL REUS W/ TWL LRG LVL3 (GOWN DISPOSABLE) ×16 IMPLANT
GOWN STRL REUS W/TWL LRG LVL3 (GOWN DISPOSABLE) ×16
GRASPER SUT TROCAR 14GX15 (MISCELLANEOUS) ×4 IMPLANT
HEMOSTAT ARISTA ABSORB 3G PWDR (HEMOSTASIS) ×2 IMPLANT
IRRIGATION STRYKERFLOW (MISCELLANEOUS) ×2 IMPLANT
IRRIGATOR STRYKERFLOW (MISCELLANEOUS) ×4
IV LACTATED RINGERS 1000ML (IV SOLUTION) ×2 IMPLANT
IV NS 1000ML (IV SOLUTION) ×4
IV NS 1000ML BAXH (IV SOLUTION) ×2 IMPLANT
KIT PINK PAD W/HEAD ARE REST (MISCELLANEOUS) ×4
KIT PINK PAD W/HEAD ARM REST (MISCELLANEOUS) ×4 IMPLANT
LABEL OR SOLS (LABEL) ×4 IMPLANT
MANIFOLD NEPTUNE II (INSTRUMENTS) ×4 IMPLANT
MANIPULATOR VCARE STD CRV RETR (MISCELLANEOUS) ×2 IMPLANT
NS IRRIG 1000ML POUR BTL (IV SOLUTION) ×2 IMPLANT
NS IRRIG 500ML POUR BTL (IV SOLUTION) ×4 IMPLANT
OBTURATOR OPTICAL STANDARD 8MM (TROCAR) ×4
OBTURATOR OPTICAL STND 8 DVNC (TROCAR) ×4
OBTURATOR OPTICALSTD 8 DVNC (TROCAR) ×4 IMPLANT
PACK GYN LAPAROSCOPIC (MISCELLANEOUS) ×4 IMPLANT
PAD OB MATERNITY 4.3X12.25 (PERSONAL CARE ITEMS) ×4 IMPLANT
PAD PREP 24X41 OB/GYN DISP (PERSONAL CARE ITEMS) ×4 IMPLANT
SCRUB CHG 4% DYNA-HEX 4OZ (MISCELLANEOUS) ×4 IMPLANT
SEAL CANN UNIV 5-8 DVNC XI (MISCELLANEOUS) ×12 IMPLANT
SEAL XI 5MM-8MM UNIVERSAL (MISCELLANEOUS) ×12
SEALER VESSEL DA VINCI XI (MISCELLANEOUS) ×4
SEALER VESSEL EXT DVNC XI (MISCELLANEOUS) ×2 IMPLANT
SET CYSTO W/LG BORE CLAMP LF (SET/KITS/TRAYS/PACK) ×4 IMPLANT
SET TUBE FILTERED XL AIRSEAL (SET/KITS/TRAYS/PACK) ×2 IMPLANT
SOL PREP PVP 2OZ (MISCELLANEOUS) ×4
SOLUTION ELECTROLUBE (MISCELLANEOUS) ×4 IMPLANT
SOLUTION PREP PVP 2OZ (MISCELLANEOUS) ×4 IMPLANT
SURGILUBE 2OZ TUBE FLIPTOP (MISCELLANEOUS) ×4 IMPLANT
SUT MNCRL 4-0 (SUTURE) ×12
SUT MNCRL 4-0 27XMFL (SUTURE) ×12
SUT VIC AB 0 CT2 27 (SUTURE) ×8 IMPLANT
SUT VIC AB 2-0 CT1 (SUTURE) ×2 IMPLANT
SUT VLOC 90 2/L VL 12 GS22 (SUTURE) ×2 IMPLANT
SUTURE MNCRL 4-0 27XMF (SUTURE) ×8 IMPLANT
TRAP FLUID SMOKE EVACUATOR (MISCELLANEOUS) ×2 IMPLANT
WATER STERILE IRR 500ML POUR (IV SOLUTION) ×4 IMPLANT

## 2022-01-24 NOTE — Transfer of Care (Signed)
Immediate Anesthesia Transfer of Care Note  Patient: Cheryl Day  Procedure(s) Performed: XI ROBOTIC ASSISTED TOTAL HYSTERECTOMY RIGHT SALPINGECTOMY LAPAROSCOPIC EXCISION AND FULGERATION OF ENDOMETRIOSIS  Patient Location: PACU  Anesthesia Type:General  Level of Consciousness: awake, alert , and oriented  Airway & Oxygen Therapy: Patient Spontanous Breathing and Patient connected to face mask oxygen  Post-op Assessment: Report given to RN and Post -op Vital signs reviewed and stable  Post vital signs: Reviewed and stable  Last Vitals:  Vitals Value Taken Time  BP 145/82 01/24/22 1100  Temp    Pulse 75 01/24/22 1102  Resp 24 01/24/22 1103  SpO2 98 % 01/24/22 1102  Vitals shown include unvalidated device data.  Last Pain:  Vitals:   01/24/22 0701  TempSrc: Temporal  PainSc: 0-No pain         Complications: No notable events documented.

## 2022-01-24 NOTE — Op Note (Addendum)
Cheryl Day PROCEDURE DATE: 01/24/2022  PREOPERATIVE DIAGNOSIS: Dysmenorrhea, menorrhagia POSTOPERATIVE DIAGNOSIS: The same + stage 3 endometriosis PROCEDURE:  XI ROBOTIC ASSISTED TOTAL HYSTERECTOMY RIGHT SALPINGECTOMY: 29518 (CPT) LAPAROSCOPIC EXCISION AND FULGERATION OF ENDOMETRIOSIS: 84166 (CPT)  Because of BMI of 48 and extensive endometriosis, modifier 22 is appropriate for this case.  An experienced assistant was required given the standard of surgical care given the complexity of the case.  This assistant was needed for exposure, dissection, suctioning, retraction, instrument exchange, Dr. Glennon Mac assisting with delivery of these services during the procedure.   SURGEON:  Dr. Benjaman Kindler, MD ASSISTANT: Dr. Prentice Docker, MD  Anesthesiologist:  Anesthesiologist: Molli Barrows, MD CRNA: Demetrius Charity, CRNA; Rolla Plate, CRNA  INDICATIONS: 46 y.o. Fhere for definitive surgical management secondary to the indications listed under preoperative diagnoses; please see preoperative note for further details.  Risks of surgery were discussed with the patient including but not limited to: bleeding which may require transfusion or reoperation; infection which may require antibiotics; injury to bowel, bladder, ureters or other surrounding organs; need for additional procedures; thromboembolic phenomenon, incisional problems and other postoperative/anesthesia complications. Written informed consent was obtained.    FINDINGS:   Pelvic: External genitalia negative for lesions. Vagina negative. Adnexa negative for masses or nodularity. Cervix without gross lesions. Uterus mobile, anteverted, small.  Intraoperative findings revealed a normal upper abdomen including bowel, diaphragmatic surfaces, stomach, and omentum.  The uterus was irregularly shaped with endometriosis and scarring along the  borders. It was mobile.  The right and left ovaries had significant punctate dark endometriosis  lesions and clear blebs throughout.  Left tube was missing, and right tube was strongly adherent to the right ovary.   Appendix was not visualized. Endometriosis implants were noted throughout the pelvis, on the bladder, with active lesions on the serosa and epiploica of the colon, and above the pelvic brim in the bilateral paracolic gutters.  Where safe, the endometriosis was excised or fulgurated.   ANESTHESIA:    General INTRAVENOUS FLUIDS:1000  ml ESTIMATED BLOOD LOSS:20 ml URINE OUTPUT: 800 ml  SPECIMENS: Uterus, cervix, right fallopian tube. Endometriosis excision from the left paracolic gutter and right superior pelvic sidewall. Other area were fulgurated. COMPLICATIONS: None immediate   RATLH/BS:  PROCEDURE IN DETAIL: After informed consent was obtained, the patient was taken to the operating room where general anesthesia was obtained without difficulty. The patient was positioned in the dorsal lithotomy position in [place] and her arms were carefully tucked at her sides and the usual precautions were taken. Deep Trendelenburg (20-25 deg) was established to confirm that she does not shift on the table.  She was prepped and draped in normal sterile fashion.  Time-out was performed and a Foley catheter was placed into the bladder. A standard VCare uterine manipulator was then placed in the uterus without incident.  Preoperative prophylactic antibiotics were given through her iv.  After infiltration of local anesthetic at the proposed trocar sites, an 8 mm incision was created at the umbilicus, and an AirSeal 72m was placed under direct visualization, after confirmation of OG tube working well. Pneumoperitoneum was created to a pressure of 15 mm Hg. The camera was placed and the abdomin surveyed, noting intact bowel below the site of entry. A survey of the pelvis and upper abdomen revealed the above findings. Two right and left lateral 8-mm robotic ports were placed under direct  visualization.  The patient was placed in deepTrendelenburg and the bowel was displaced up into the upper  abdomen. The robot was left side docked. The instruments were placed under direct visualization.   The ureters were identified bilaterally coursing outside of the operative field. Round ligaments were divided on each side with the EndoShears and the retroperitoneal space was opened bilaterally. The posterior leaflet of the broad was taken down to the level of the IP ligament. The anterior leaflet of the broad ligament was carefully taken down to the midline.  A bladder flap was created and the bladder was dissected down off the lower uterine segment and cervix using endoshears and electrocautery.   The Fallopian tubes were divided from the ovaries, and care taken to hemostatically transect the utero-ovarian ligament. The peritoneum was taken down to the level of the internal os, and the uterine arteries skeletonized. With strong cephalad pressure from the V-care, bipolar cautery was used to seal and transect the uterine arteries, and the pedicles allowed to fall away laterally.  A colpotomy was performed circumferentially along the V-Care ring with monopolar electrocautery and the cervix was incised from the vagina using the laparoscopic scissors. The specimen was removed through the vagina.  A pneumo balloon was placed in the vagina and the vaginal cuff was then closed in a running continuous fashion using the  0 V-Lock suture with careful attention to include the vaginal cuff angles, the uterosacral ligaments and the vaginal mucosa within the closure.  The pelvis and lower abdominal peritoneum was surveyed and endometriosis cauterized or excised. The ovaries were treated similarly.  Hemostasis was secured with 63mHg intraabdominal pressure and review of all surgical sites. The intraperitoneal pressure was dropped, and all planes of dissection, vascular pedicles and the vaginal cuff were found to be  hemostatic. Arista was placed along all surfaces. The robot was undocked. The lateral trocars were removed under visualization.  The CO2 gas was released and several deep breaths given to remove any remaining CO2 from the peritoneal cavity.  The skin incisions were closed with 4-0 Monocryl subcuticular stitch and Dermabond.    Anesthesia was reversed without difficulty.  The patient tolerated the procedure well.  Sponge, lap and needle counts were correct x2.  The patient was taken to recovery room in excellent condition.

## 2022-01-24 NOTE — Discharge Instructions (Addendum)
Discharge instructions after  robotically-assisted total laparoscopic hysterectomy   For the next three days, take ibuprofen and acetaminophen on a schedule, every 8 hours. You can take them together or you can intersperse them, and take one every four hours. I also gave you gabapentin for nighttime, to help you sleep and also to control pain. Take gabapentin medicines at night for at least the next 3 nights. You also have a narcotic, oxycodone, to take as needed if the above medicines don't help.  Postop constipation is a major cause of pain. Stay well hydrated, walk as you tolerate, and take over the counter senna as well as stool softeners if you need them.   Signs and Symptoms to Report Call our office at [phone] if you have any of the following.   Fever over 100.4 degrees or higher  Severe stomach pain not relieved with pain medications  Bright red bleeding that's heavier than a period that does not slow with rest  To go the bathroom a lot (frequency), you can't hold your urine (urgency), or it hurts when you empty your bladder (urinate)  Chest pain  Shortness of breath  Pain in the calves of your legs  Severe nausea and vomiting not relieved with anti-nausea medications  Signs of infection around your wounds, such as redness, hot to touch, swelling, green/yellow drainage (like pus), bad smelling discharge  Any concerns  What You Can Expect after Surgery  You may see some pink tinged, bloody fluid and bruising around the wound. This is normal.  You may notice shoulder and neck pain. This is caused by the gas used during surgery to expand your abdomen so your surgeon could get to the uterus easier.  You may have a sore throat because of the tube in your mouth during general anesthesia. This will go away in 2 to 3 days.  You may have some stomach cramps.  You may notice spotting on your panties.  You may have pain around the incision sites.   Activities after Your  Discharge Follow these guidelines to help speed your recovery at home:  Do the coughing and deep breathing as you did in the hospital for 2 weeks. Use the small blue breathing device, called the incentive spirometer for 2 weeks.  Don't drive if you are in pain or taking narcotic pain medicine. You may drive when you can safely slam on the brakes, turn the wheel forcefully, and rotate your torso comfortably. This is typically 1-2 weeks. Practice in a parking lot or side street prior to attempting to drive regularly.   Ask others to help with household chores for 4 weeks.  Do not lift anything heavier that 10 pounds for 4-6 weeks. This includes pets, children, and groceries.  Don't do strenuous activities, exercises, or sports like vacuuming, tennis, squash, etc. until your doctor says it is safe to do so. ---Maintain pelvic rest for 12 weeks. This means nothing in the vagina or rectum at all (no douching, tampons, intercourse) for 12 weeks.   Walk as you feel able. Rest often since it may take two or three weeks for your energy level to return to normal.   You may climb stairs  Avoid constipation:   -Eat fruits, vegetables, and whole grains. Eat small meals as your appetite will take time to return to normal.   -Drink 6 to 8 glasses of water each day unless your doctor has told you to limit your fluids.   -Use a laxative or   stool softener as needed if constipation becomes a problem. You may take Miralax, metamucil, Citrucil, Colace, Senekot, FiberCon, etc. If this does not relieve the constipation, try two tablespoons of Milk Of Magnesia every 8 hours until your bowels move.   You may shower. Gently wash the wounds with a mild soap and water. Pat dry.  Do not get in a hot tub, swimming pool, etc. for 6 weeks.  Do not use lotions, oils, powders on the wounds.  Do not douche, use tampons, or have sex until your doctor says it is okay.  Take your pain medicine when you need it. The medicine may not  work as well if the pain is bad.  Take the medicines you were taking before surgery. Other medications you will need are pain medications (Norco or Percocet) and nausea medications (Zofran).  Here is a helpful article from the website [employer], regarding constipation  Here are reasons why constipation occurs after surgery: 1) During the operation and in the recovery room, most people are given opioid pain medication, primarily through an IV, to treat moderate or severe pain. Intravenous opioids include morphine, Dilaudid and fentanyl. After surgery, patients are often prescribed opioid pain medication to take by mouth at home, including codeine, Vicodin, Norco, and Percocet. All of these medications cause constipation by slowing down the movement of your intestine. 2) Changes in your diet before surgery can be another culprit. It is common to get specific instructions to change how you normally eat or drink before your surgery, like only having liquids the day before or not having anything to eat or drink after midnight the night before surgery. For this reason, temporary dehydration may occur. This, along with not eating or only having liquids, means that you are getting less fiber than usual. Both these factors contribute to constipation. 3) Changes in your diet after surgery can also contribute to the problem. Although many people don't have dietary restrictions after operations, being under anesthesia can make you lose your appetite for several hours and maybe even days. Some people can even have nausea or vomiting. Not eating or drinking normally means that you are not getting enough fiber and you can get dehydrated, both leading to constipation. 4) Lying in a bed more than usual--which happens before, during and after surgery--combined with the medications and diet changes, all work together to slow down your colon and make your poop turn to rock.  No one likes to be constipated.  Let's face  it, it's not a pleasant feeling when you don't poop for days, then strain on the toilet to finally pass something large enough to cause damage. An ounce of prevention is worth a pound of cure, so: Assume you will be constipated. Plan and prepare accordingly. Post-surgery is one of those unique situations where the temporary use of laxatives can make a world of difference. Always consult with your doctor, and recognize that if you wait several days after surgery to take a laxative, the constipation might be too severe for these over-the-counter options. It is always important to discuss all medications you plan on taking with your doctor. Ask your doctor if you can start the laxative immediately after surgery. *  Here are go-to post-surgery laxatives: Senna: Senna is an herb that acts as a "stimulant laxative," meaning it increases the activity of the intestine to cause you to have a bowel movement. It comes in many forms, but senna pills are easy to take and are sold over the counter  at almost all pharmacies. Since opioid pain medications slow down the activity of the intestine, it makes sense to take a medication to help reverse that side effect. Long-term use of a stimulant laxative is not a good idea since it can make your colon "lazy" and not function properly; however, temporary use immediately after surgery is acceptable. In general, if you are able to eat a normal diet, taking senna soon after surgery works the best. Senna usually works within hours to produce a bowel movement, but this is less predictable when you are taking different medications after surgery. Try not to wait several days to start taking senna, as often it is too late by then. Just like with all medications or supplements, check with your doctor before starting new treatment.   Magnesium: Magnesium is an important mineral that our body needs. We get magnesium from some foods that we eat, especially foods that are high in fiber such  as broccoli, almonds and whole grains. There are also magnesium-based medications used to treat constipation including milk of magnesia (magnesium hydroxide), magnesium citrate and magnesium oxide. They work by drawing water into the intestine, putting it into the class of "osmotic" laxatives. Magnesium products in low doses appear to be safe, but if taken in very large doses, can lead to problems such as irregular heartbeat, low blood pressure and even death. It can also affect other medications you might be taking, therefore it is important to discuss using magnesium with your physician and pharmacist before initiating therapy. Most over-the-counter magnesium laxatives work very well to help with the constipation related to surgery, but sometimes they work too well and lead to diarrhea. Make sure you are somewhere with easy access to a bathroom, just in case.   Bisacodyl: Bisacodyl (generic name) is sold under brand names such as Dulcolax. Much like senna, it is a "stimulant laxative," meaning it makes your intestines move more quickly to push out the stool. This is another good choice to start taking as soon as your doctor says you can take a laxative after surgery. It comes in pill form and as a suppository, which is a good choice for people who cannot or are not allowed to swallow pills. Studies have shown that it works as a laxative, but like most of these medications, you should use this on a short-term basis only.   Enema: Enemas strike fear in many people, but FEAR NOT! It's nowhere near as big a deal as you may think. An enema is just a way to get some liquid into your rectum by placing a specially designed device through your anus. If you have never done one, it might seem like a painful, unpleasant, uncomfortable, complicated and lengthy procedure. But in reality, it's simple, takes just a few seconds and is highly effective. The small ready-made bottles you buy at the pharmacy are much easier than  the hose/large rubber container type. Those recommended positions illustrated in some instructions are generally not necessary to place the enema. It's very similar to the insertion of a tampon, requiring a slight squat. Some extra lubrication on the enema's tip (or on your anus) will make it a breeze. In certain cases, there is no substitute for a good enema. For example, if someone has not pooped for a few days, the beginning of the poop waiting to come out can become rock hard. Passing that hard stool can lead to much pain and problems like anal fissures. Inserting a little liquid to break  up the rock-hard stool will help make its passage much easier. Enemas come with different liquids. Most come with saline, but there are also mineral oil options. You can also use warm water in the reusable enema containers. They all work. But since saline can sometimes be irritating, so try a mineral oil or water enema instead.  Here are commonly recommended constipation medications that do not work well for post-surgery constipation: Docusate: Docusate (generic name) most commonly referred to as Colace (brand name) is not really a laxative, but is classified as a stool softener. Although this medication is commonly prescribed, it is not recommended for several reasons: 1) there is no good medical evidence that it works 2) even if it has an effect, which is very questionable, it is minimal and cannot combat the intestinal slowing caused by the opioid medications. Skip docusate to save money and space in your pillbox for something more effective.  PEG: Miralax (brand name) is basically a chemical called polyethylene glycol (PEG) and it has gained tremendous popularity as a laxative. This product is an "osmotic laxative" meaning it works by pulling water into the stool, making it softer. This is very similar to the action of natural fiber in foods and supplements. Therefore, the effect seen by this medication is not  immediate, causing a bowel movement in a day or more. Is this medication strong enough to battle the constipation related to having an operation? Maybe for some people not prone to constipation. But for most people, other laxatives are better to prevent constipation after surgery.   AMBULATORY SURGERY  DISCHARGE INSTRUCTIONS   The drugs that you were given will stay in your system until tomorrow so for the next 24 hours you should not:  Drive an automobile Make any legal decisions Drink any alcoholic beverage   You may resume regular meals tomorrow.  Today it is better to start with liquids and gradually work up to solid foods.  You may eat anything you prefer, but it is better to start with liquids, then soup and crackers, and gradually work up to solid foods.   Please notify your doctor immediately if you have any unusual bleeding, trouble breathing, redness and pain at the surgery site, drainage, fever, or pain not relieved by medication.    Your post-operative visit with Dr.                                       is: Date:                        Time:    Please call to schedule your post-operative visit.  Additional Instructions:

## 2022-01-24 NOTE — Anesthesia Preprocedure Evaluation (Signed)
Anesthesia Evaluation  Patient identified by MRN, date of birth, ID band Patient awake    Reviewed: Allergy & Precautions, H&P , NPO status , Patient's Chart, lab work & pertinent test results, reviewed documented beta blocker date and time   History of Anesthesia Complications (+) PONV, Family history of anesthesia reaction and history of anesthetic complications  Airway Mallampati: II  TM Distance: >3 FB Neck ROM: full    Dental  (+) Teeth Intact   Pulmonary neg pulmonary ROS   Pulmonary exam normal        Cardiovascular Exercise Tolerance: Good negative cardio ROS Normal cardiovascular exam Rhythm:regular Rate:Normal  Describes Ehrlor Danlos syndrome and Mast cell phenomenon. 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, classic type No date: Family history of adverse reaction to anesthesia     Comment:  father had Ehlers Danlos with vascular involvement, he               died in his early 25'Z from complications of anesthesia No date: Hypothyroidism 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) No date: PONV (postoperative nausea and vomiting)     Comment:  severe 04/2007: Thyroid cancer ([place]) Past Surgical History: No date: BREAST BIOPSY; Right     Comment:  benign 1996: BREAST CYST ASPIRATION; Left     Comment:  cyst aspiration, benign  2010: ECTOPIC PREGNANCY SURGERY No date: KNEE  ARTHROSCOPY; Left     Comment:  x 5 (2019-2020) 2009: PARATHYROIDECTOMY 1991: TONSILLECTOMY 2009: TOTAL THYROIDECTOMY BMI    Body Mass Index: 48.36 kg/m     Reproductive/Obstetrics negative OB ROS                             Anesthesia Physical Anesthesia Plan  ASA: 3  Anesthesia Plan: General ETT   Post-op Pain Management:    Induction:   PONV Risk Score and Plan: 4 or greater and Diphenhydramine, Scopolamine patch - Pre-op, Dexamethasone and Ondansetron  Airway Management Planned:   Additional Equipment:   Intra-op Plan:   Post-operative Plan:   Informed Consent: I have reviewed the patients History and Physical, chart, labs and discussed the procedure including the risks, benefits and alternatives for the proposed anesthesia with the patient or authorized representative who has indicated his/her understanding and acceptance.     Dental Advisory Given  Plan Discussed with: CRNA  Anesthesia Plan Comments:        Anesthesia Quick Evaluation

## 2022-01-24 NOTE — Progress Notes (Signed)
Pt requested warm compresses after IV discontinuation. I explained we do not have warm compresses but we would make one for her. We placed a warm face cloth in a Ziploc bag. The patient stated it was not what she wanted.

## 2022-01-24 NOTE — Interval H&P Note (Signed)
History and Physical Interval Note:  01/24/2022 7:47 AM  Cheryl Day  has presented today for surgery, with the diagnosis of menometrorrhagia.  The various methods of treatment have been discussed with the patient and family. After consideration of risks, benefits and other options for treatment, the patient has consented to  Procedure(s): XI ROBOTIC ASSISTED LAPAROSCOPIC HYSTERECTOMY AND SALPINGECTOMY (Bilateral) CYSTOSCOPY (N/A) as a surgical intervention.  The patient's history has been reviewed, patient examined, no change in status, stable for surgery.  I have reviewed the patient's chart and labs.  Questions were answered to the patient's satisfaction.     Benjaman Kindler

## 2022-01-24 NOTE — Anesthesia Procedure Notes (Signed)
Procedure Name: Intubation Date/Time: 01/24/2022 8:05 AM  Performed by: Demetrius Charity, CRNAPre-anesthesia Checklist: Patient identified, Patient being monitored, Timeout performed, Emergency Drugs available and Suction available Patient Re-evaluated:Patient Re-evaluated prior to induction Oxygen Delivery Method: Circle system utilized Preoxygenation: Pre-oxygenation with 100% oxygen Induction Type: IV induction Ventilation: Mask ventilation without difficulty Laryngoscope Size: 3 and McGraph Grade View: Grade I Tube type: Oral Tube size: 6.5 mm Number of attempts: 1 Airway Equipment and Method: Stylet and Video-laryngoscopy Placement Confirmation: ETT inserted through vocal cords under direct vision, positive ETCO2 and breath sounds checked- equal and bilateral Secured at: 21 cm Tube secured with: Tape Dental Injury: Teeth and Oropharynx as per pre-operative assessment

## 2022-01-25 LAB — SURGICAL PATHOLOGY

## 2022-01-25 NOTE — Anesthesia Postprocedure Evaluation (Signed)
Anesthesia Post Note  Patient: Cheryl Day  Procedure(s) Performed: XI ROBOTIC ASSISTED TOTAL HYSTERECTOMY RIGHT SALPINGECTOMY (Abdomen) LAPAROSCOPIC EXCISION AND FULGERATION OF ENDOMETRIOSIS (Uterus)  Patient location during evaluation: PACU Anesthesia Type: General Level of consciousness: awake and alert Pain management: pain level controlled Vital Signs Assessment: post-procedure vital signs reviewed and stable Respiratory status: spontaneous breathing, nonlabored ventilation, respiratory function stable and patient connected to nasal cannula oxygen Cardiovascular status: blood pressure returned to baseline and stable Postop Assessment: no apparent nausea or vomiting Anesthetic complications: no   No notable events documented.   Last Vitals:  Vitals:   01/24/22 1230 01/24/22 1307  BP: (!) 155/97 (!) 154/93  Pulse: 81 (!) 57  Resp: 17   Temp: 36.4 C (!) 36.3 C  SpO2: 92% 93%    Last Pain:  Vitals:   01/24/22 1307  TempSrc: Temporal  PainSc: [name]

## 2022-10-29 ENCOUNTER — Encounter: Payer: Self-pay | Admitting: Obstetrics and Gynecology

## 2022-11-08 ENCOUNTER — Other Ambulatory Visit: Payer: Self-pay | Admitting: Obstetrics and Gynecology

## 2022-11-08 DIAGNOSIS — N6320 Unspecified lump in the left breast, unspecified quadrant: Secondary | ICD-10-CM

## 2022-11-11 ENCOUNTER — Other Ambulatory Visit: Payer: Self-pay | Admitting: Obstetrics and Gynecology

## 2022-11-11 DIAGNOSIS — Z1231 Encounter for screening mammogram for malignant neoplasm of breast: Secondary | ICD-10-CM

## 2022-11-11 DIAGNOSIS — N6320 Unspecified lump in the left breast, unspecified quadrant: Secondary | ICD-10-CM

## 2022-11-22 ENCOUNTER — Ambulatory Visit
Admission: RE | Admit: 2022-11-22 | Discharge: 2022-11-22 | Disposition: A | Discharge status: Home / Self Care | Payer: BC Managed Care – PPO | Source: Ambulatory Visit | Attending: Obstetrics and Gynecology | Admitting: Obstetrics and Gynecology

## 2022-11-22 ENCOUNTER — Encounter: Payer: Self-pay | Admitting: Radiology

## 2022-11-22 DIAGNOSIS — N6320 Unspecified lump in the left breast, unspecified quadrant: Secondary | ICD-10-CM | POA: Insufficient documentation

## 2022-11-22 DIAGNOSIS — Z1231 Encounter for screening mammogram for malignant neoplasm of breast: Secondary | ICD-10-CM | POA: Diagnosis present

## 2023-11-11 ENCOUNTER — Encounter: Payer: Self-pay | Admitting: *Deleted
